# Patient Record
Sex: Female | Born: 1975 | Race: Black or African American | Hispanic: No | Marital: Married | Smoking: Never smoker
Health system: Southern US, Community
[De-identification: ages and names within clinical notes are randomized; demographics above are authoritative.]

## PROBLEM LIST (undated history)

## (undated) DIAGNOSIS — D500 Iron deficiency anemia secondary to blood loss (chronic): Principal | ICD-10-CM

## (undated) DIAGNOSIS — D473 Essential (hemorrhagic) thrombocythemia: Secondary | ICD-10-CM

## (undated) HISTORY — DX: Essential (hemorrhagic) thrombocythemia: D47.3

## (undated) HISTORY — PX: OTHER SURGICAL HISTORY: SHX169

## (undated) HISTORY — DX: Iron deficiency anemia secondary to blood loss (chronic): D50.0

---

## 2003-09-30 ENCOUNTER — Emergency Department (HOSPITAL_COMMUNITY): Admission: EM | Admit: 2003-09-30 | Discharge: 2003-09-30 | Payer: Self-pay | Admitting: Family Medicine

## 2004-02-15 ENCOUNTER — Emergency Department (HOSPITAL_COMMUNITY): Admission: EM | Admit: 2004-02-15 | Discharge: 2004-02-15 | Payer: Self-pay | Admitting: Emergency Medicine

## 2005-10-16 ENCOUNTER — Ambulatory Visit: Payer: Self-pay | Admitting: Obstetrics and Gynecology

## 2005-10-16 ENCOUNTER — Encounter (INDEPENDENT_AMBULATORY_CARE_PROVIDER_SITE_OTHER): Payer: Self-pay | Admitting: Specialist

## 2006-12-14 ENCOUNTER — Other Ambulatory Visit: Admission: RE | Admit: 2006-12-14 | Discharge: 2006-12-14 | Payer: Self-pay | Admitting: Obstetrics and Gynecology

## 2008-02-23 ENCOUNTER — Other Ambulatory Visit: Admission: RE | Admit: 2008-02-23 | Discharge: 2008-02-23 | Payer: Self-pay | Admitting: Gynecology

## 2008-04-12 ENCOUNTER — Ambulatory Visit (HOSPITAL_COMMUNITY): Admission: RE | Admit: 2008-04-12 | Discharge: 2008-04-12 | Payer: Self-pay | Admitting: Obstetrics and Gynecology

## 2009-11-08 ENCOUNTER — Emergency Department (HOSPITAL_COMMUNITY): Admission: EM | Admit: 2009-11-08 | Discharge: 2009-11-08 | Payer: Self-pay | Admitting: Family Medicine

## 2010-10-18 NOTE — Group Therapy Note (Signed)
NAME:  PAULETTE, ROCKFORD NO.:  [id number]   MEDICAL RECORD NO.:  [id number]          PATIENT TYPE:  WOC   LOCATION:  [facility]                   FACILITY:  [facility]   PHYSICIAN:  Argentina Donovan, MD        DATE OF BIRTH:  Jul 01, 1975   DATE OF SERVICE:  10/16/2005                                    CLINIC NOTE   HISTORY:  The patient is a 35 year old gravida 1, para 1-0-0-1 with a child  73 years old by normal vaginal delivery who has no health problems or  complaints.  She thought she felt lumps in her upper abdomen and is in for a  Pap smear.   PHYSICAL EXAMINATION:  ABDOMEN:  The upper abdomen is soft, flat, nontender.  No masses, no organomegaly palpated.  GYN:  External genitalia are normal.  BUS within normal limits.  Vagina is  clean and well rugated.  Cervix is clean and parous.  Uterus is anterior;  normal size, shape and consistency.  Adnexa could not be palpated because of  the __________  of the patient.  In addition, the patient was instructed in  monthly breast examinations.  The patient had a normal GYN examination.           ______________________________  Argentina Donovan, MD     PR/MEDQ  D:  10/16/2005  T:  10/17/2005  Job:  [id number]

## 2011-05-19 ENCOUNTER — Emergency Department (INDEPENDENT_AMBULATORY_CARE_PROVIDER_SITE_OTHER)
Admission: EM | Admit: 2011-05-19 | Discharge: 2011-05-19 | Disposition: A | Discharge status: Home / Self Care | Payer: Self-pay | Source: Home / Self Care

## 2011-05-19 DIAGNOSIS — S39012A Strain of muscle, fascia and tendon of lower back, initial encounter: Secondary | ICD-10-CM

## 2011-05-19 DIAGNOSIS — S161XXA Strain of muscle, fascia and tendon at neck level, initial encounter: Secondary | ICD-10-CM

## 2011-05-19 DIAGNOSIS — S139XXA Sprain of joints and ligaments of unspecified parts of neck, initial encounter: Secondary | ICD-10-CM

## 2011-05-19 DIAGNOSIS — S335XXA Sprain of ligaments of lumbar spine, initial encounter: Secondary | ICD-10-CM

## 2011-05-19 MED ORDER — IBUPROFEN 800 MG PO TABS: 800.0000 mg | ORAL_TABLET | Freq: Three times a day (TID) | ORAL | Status: AC

## 2011-05-19 MED ORDER — METHOCARBAMOL 750 MG PO TABS: 750.0000 mg | ORAL_TABLET | Freq: Four times a day (QID) | ORAL | Status: AC
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
## 2011-05-19 NOTE — ED Provider Notes (Signed)
Medical screening examination/treatment/procedure(s) were performed by non-physician practitioner and as supervising physician I was immediately available for consultation/collaboration.  Raynald Blend, MD 05/19/11 1451

## 2014-02-02 ENCOUNTER — Other Ambulatory Visit (HOSPITAL_COMMUNITY)
Admission: RE | Admit: 2014-02-02 | Discharge: 2014-02-02 | Disposition: A | Discharge status: Home / Self Care | Payer: BC Managed Care – PPO | Source: Ambulatory Visit | Attending: Family | Admitting: Family

## 2014-02-02 ENCOUNTER — Other Ambulatory Visit: Payer: Self-pay | Admitting: Family

## 2014-02-02 ENCOUNTER — Other Ambulatory Visit: Payer: Self-pay

## 2014-02-02 DIAGNOSIS — Z1231 Encounter for screening mammogram for malignant neoplasm of breast: Secondary | ICD-10-CM

## 2014-02-02 DIAGNOSIS — Z113 Encounter for screening for infections with a predominantly sexual mode of transmission: Secondary | ICD-10-CM | POA: Diagnosis present

## 2014-02-02 DIAGNOSIS — N760 Acute vaginitis: Secondary | ICD-10-CM | POA: Diagnosis present

## 2014-02-02 DIAGNOSIS — Z0000 Encounter for general adult medical examination without abnormal findings: Secondary | ICD-10-CM | POA: Insufficient documentation

## 2014-02-03 ENCOUNTER — Other Ambulatory Visit: Payer: Self-pay | Admitting: Family

## 2014-02-03 DIAGNOSIS — R1011 Right upper quadrant pain: Secondary | ICD-10-CM

## 2014-02-07 LAB — CYTOLOGY - PAP

## 2014-02-16 ENCOUNTER — Ambulatory Visit
Admission: RE | Admit: 2014-02-16 | Discharge: 2014-02-16 | Disposition: A | Discharge status: Home / Self Care | Payer: BC Managed Care – PPO | Source: Ambulatory Visit | Attending: Family | Admitting: Family

## 2014-02-16 DIAGNOSIS — R1011 Right upper quadrant pain: Secondary | ICD-10-CM

## 2014-02-17 ENCOUNTER — Ambulatory Visit
Admission: RE | Admit: 2014-02-17 | Discharge: 2014-02-17 | Disposition: A | Discharge status: Home / Self Care | Payer: BC Managed Care – PPO | Source: Ambulatory Visit | Attending: Family | Admitting: Family

## 2014-02-17 DIAGNOSIS — Z1231 Encounter for screening mammogram for malignant neoplasm of breast: Secondary | ICD-10-CM

## 2014-02-20 ENCOUNTER — Other Ambulatory Visit: Payer: Self-pay | Admitting: Family

## 2014-02-20 DIAGNOSIS — R928 Other abnormal and inconclusive findings on diagnostic imaging of breast: Secondary | ICD-10-CM

## 2014-02-21 ENCOUNTER — Telehealth: Payer: Self-pay | Admitting: Hematology and Oncology

## 2014-02-21 NOTE — Telephone Encounter (Signed)
PATIENT CALLED TO CONFIRM NP APPT FOR 10/07 @ 10:45 W/DR. [place]

## 2014-02-21 NOTE — Telephone Encounter (Signed)
LEFT MESSAGE FOR PATIENT AND GAVE NP APPT FOR 10/05 @ 2 W/DR. [place]. CONTACT INFORMATION LEFT FOR PATIENT TO RETURN CALL TO CONFIRM MESSAGE/NP APPT.

## 2014-02-27 ENCOUNTER — Ambulatory Visit
Admission: RE | Admit: 2014-02-27 | Discharge: 2014-02-27 | Disposition: A | Discharge status: Home / Self Care | Payer: BC Managed Care – PPO | Source: Ambulatory Visit | Attending: Family | Admitting: Family

## 2014-02-27 DIAGNOSIS — R928 Other abnormal and inconclusive findings on diagnostic imaging of breast: Secondary | ICD-10-CM

## 2014-03-06 ENCOUNTER — Ambulatory Visit: Payer: BC Managed Care – PPO | Admitting: Hematology and Oncology

## 2014-03-06 ENCOUNTER — Ambulatory Visit: Payer: BC Managed Care – PPO

## 2014-03-08 ENCOUNTER — Ambulatory Visit (HOSPITAL_BASED_OUTPATIENT_CLINIC_OR_DEPARTMENT_OTHER): Payer: BC Managed Care – PPO | Admitting: Hematology and Oncology

## 2014-03-08 ENCOUNTER — Encounter (INDEPENDENT_AMBULATORY_CARE_PROVIDER_SITE_OTHER): Payer: Self-pay

## 2014-03-08 ENCOUNTER — Telehealth: Payer: Self-pay | Admitting: Hematology and Oncology

## 2014-03-08 ENCOUNTER — Encounter: Payer: Self-pay | Admitting: Hematology and Oncology

## 2014-03-08 ENCOUNTER — Ambulatory Visit: Payer: BC Managed Care – PPO

## 2014-03-08 VITALS — BP 113/77 | HR 74 | Temp 98.9°F | Resp 18 | Ht 63.0 in | Wt 185.1 lb

## 2014-03-08 DIAGNOSIS — D75839 Thrombocytosis, unspecified: Secondary | ICD-10-CM

## 2014-03-08 DIAGNOSIS — D473 Essential (hemorrhagic) thrombocythemia: Secondary | ICD-10-CM

## 2014-03-08 DIAGNOSIS — D500 Iron deficiency anemia secondary to blood loss (chronic): Secondary | ICD-10-CM

## 2014-03-08 HISTORY — DX: Thrombocytosis, unspecified: D75.839

## 2014-03-08 HISTORY — DX: Iron deficiency anemia secondary to blood loss (chronic): D50.0

## 2014-03-08 NOTE — Telephone Encounter (Signed)
gv adn printed appt sched and avs for tpf ro OCT, NOV adn Dec....sed added tx.

## 2014-03-08 NOTE — Progress Notes (Signed)
Checked in new patient with no financial issues prior to seeing the dr. She has appt card and has not been out of [place]

## 2014-03-08 NOTE — Assessment & Plan Note (Signed)
The most likely cause of her anemia is due to chronic blood loss. We discussed some of the risks, benefits, and alternatives of intravenous iron infusions. The patient is symptomatic from anemia and the iron level is critically low. She tolerated oral iron supplement poorly and desires to achieved higher levels of iron faster for adequate hematopoesis. Some of the side-effects to be expected including risks of infusion reactions, phlebitis, headaches, nausea and fatigue.  The patient is willing to proceed. Patient education material was dispensed.  Goal is to keep ferritin level greater than 50

## 2014-03-08 NOTE — Progress Notes (Signed)
[facility] NOTE  Patient Care Team: Eloise Levels, NP as PCP - General (Nurse Practitioner)  CHIEF COMPLAINTS/PURPOSE OF CONSULTATION:  Anemia and thrombocytosis  HISTORY OF PRESENTING ILLNESS:  Tonya Pollard 38 y.o. female is here because of abnormal CBC.  She was found to have abnormal CBC from routine blood work. Review of her most recent CBC on 02/15/14 showed hemoglobin of 10.7 with platelet count of 596,000. In the past, she had hemoglobin as low as 6. She denies recent chest pain on exertion, shortness of breath on minimal exertion, pre-syncopal episodes, or palpitations. She complained of dizziness, excessive fatigue and significant leg cramps. She had not noticed any recent bleeding such as epistaxis, hematuria or hematochezia. She has menorrhagia, with typical menstrual cycle lasting 7 days with cycle of 30 days and passage of heavy clots. She had prior diagnosis of uterine fibroids s/p uterine ablatioon. The patient take over the counter NSAID ingestion. She is not on antiplatelets agents.  She had no prior history or diagnosis of cancer. Her age appropriate screening programs are up-to-date. She eats a variety of diet. She has pica with excessive chewing of ice. She never donated blood or received blood transfusion The patient was prescribed oral iron supplements and she takes twice a day for many months, complicated by constipation.  MEDICAL HISTORY:  Past Medical History  Diagnosis Date  . Iron deficiency anemia due to chronic blood loss 03/08/2014  . Thrombocytosis 03/08/2014    SURGICAL HISTORY: Past Surgical History  Procedure Laterality Date  . Fibroids      SOCIAL HISTORY: History   Social History  . Marital Status: Married    Spouse Name: N/A    Number of Children: N/A  . Years of Education: N/A   Occupational History  . Not on file.   Social History Main Topics  . Smoking status: Never Smoker   . Smokeless tobacco: Never Used   . Alcohol Use: No  . Drug Use: No  . Sexual Activity: Not on file   Other Topics Concern  . Not on file   Social History Narrative  . No narrative on file    FAMILY HISTORY: Family History  Problem Relation Age of Onset  . Cancer Maternal Aunt     female cancer  . Cancer Maternal Uncle     throat ca  . Cancer Paternal Aunt     breast ca  . Cancer Paternal Aunt     ovarian ca    ALLERGIES:  is allergic to penicillins.  MEDICATIONS:  Current Outpatient Prescriptions  Medication Sig Dispense Refill  . Iron TABS Take by mouth 2 (two) times daily.       No current facility-administered medications for this visit.    REVIEW OF SYSTEMS:   Constitutional: Denies fevers, chills or abnormal night sweats Eyes: Denies blurriness of vision, double vision or watery eyes Ears, nose, mouth, throat, and face: Denies mucositis or sore throat Respiratory: Denies cough, dyspnea or wheezes Cardiovascular: Denies palpitation, chest discomfort or lower extremity swelling Gastrointestinal:  Denies nausea, heartburn or change in bowel habits Skin: Denies abnormal skin rashes Lymphatics: Denies new lymphadenopathy or easy bruising Neurological:Denies numbness, tingling or new weaknesses Behavioral/Psych: Mood is stable, no new changes  All other systems were reviewed with the patient and are negative.  PHYSICAL EXAMINATION: ECOG PERFORMANCE STATUS: 1 - Symptomatic but completely ambulatory  Filed Vitals:   03/08/14 1046  BP: 113/77  Pulse: 74  Temp: 98.9 F (37.2 C)  Resp: 18   Filed Weights   03/08/14 1046  Weight: 185 lb 1.6 oz (83.961 kg)    GENERAL:alert, no distress and comfortable SKIN: skin color, texture, turgor are normal, no rashes or significant lesions EYES: normal, conjunctiva are pale and non-injected, sclera clear OROPHARYNX:no exudate, no erythema and lips, buccal mucosa, and tongue normal  NECK: supple, thyroid normal size, non-tender, without  nodularity LYMPH:  no palpable lymphadenopathy in the cervical, axillary or inguinal LUNGS: clear to auscultation and percussion with normal breathing effort HEART: regular rate & rhythm and no murmurs and no lower extremity edema ABDOMEN:abdomen soft, non-tender and normal bowel sounds Musculoskeletal:no cyanosis of digits and no clubbing  PSYCH: alert & oriented x 3 with fluent speech NEURO: no focal motor/sensory deficits  LABORATORY DATA:  I have reviewed the data as listed Recent Results (from the past 2160 hour(s))  CYTOLOGY - PAP     Status: None   Collection Time    02/02/14 12:00 AM      Result Value Ref Range   CYTOLOGY - PAP PAP RESULT      RADIOGRAPHIC STUDIES: I have personally reviewed the radiological images as listed and agreed with the findings in the report. Mm Digital Diagnostic Bilat  02/27/2014   CLINICAL DATA:  Possible bilateral breast masses at recent screening mammography.  EXAM: DIGITAL DIAGNOSTIC  BILATERAL MAMMOGRAM WITH CAD  COMPARISON:  Baseline screening mammogram dated 02/17/2014.  ACR Breast Density Category c: The breast tissue is heterogeneously dense, which may obscure small masses.  FINDINGS: Spot compression views of both breasts and a true lateral view of the right breast demonstrate normal appearing fibroglandular tissue at the locations of the recently suspected bilateral breast masses.  Mammographic images were processed with CAD.  IMPRESSION: No evidence of malignancy. The recently suspected bilateral breast masses represented overlapping of normal fibroglandular tissue  RECOMMENDATION: Bilateral screening mammogram in 1 year.  I have discussed the findings and recommendations with the patient. Results were also provided in writing at the conclusion of the visit. If applicable, a reminder letter will be sent to the patient regarding the next appointment.  BI-RADS CATEGORY  1: Negative.   Electronically Signed   By: Enrique Sack M.D.   On: 02/27/2014  07:58   Mm Digital Screening Bilateral  02/17/2014   CLINICAL DATA:  Screening.  EXAM: DIGITAL SCREENING BILATERAL MAMMOGRAM WITH CAD  COMPARISON:  Previous Exam(s)  ACR Breast Density Category c: The breast tissue is heterogeneously dense, which may obscure small masses.  FINDINGS: In the left breast possible mass warrants further imaging evaluation with spot compression views and possible ultrasound. In the right breast, possible mass warrants further imaging evaluation with spot compression views and possible ultrasound. Images were processed with CAD.  IMPRESSION: Further imaging evaluation is suggested for possible mass in the left breast.  Further imaging evaluation is suggested for possible mass in the right breast.  RECOMMENDATION: Diagnostic mammogram and possibly ultrasound of both breasts. (Code:FI-B-88M)  The patient will be contacted regarding the findings, and additional imaging will be scheduled.  BI-RADS CATEGORY  0: Incomplete. Need additional imaging evaluation and/or prior mammograms for comparison.   Electronically Signed   By: Marin Olp M.D.   On: 02/17/2014 15:11   US Abdomen Limited Ruq  02/16/2014   CLINICAL DATA:  Right upper quadrant pain  EXAM: US ABDOMEN LIMITED - RIGHT UPPER QUADRANT  COMPARISON:  None.  FINDINGS: Gallbladder:  No gallstones or wall thickening visualized. No sonographic Percell Miller  sign noted.  Common bile duct:  Diameter: 5 mm  Liver:  No focal lesion identified. Within normal limits in parenchymal echogenicity.  IMPRESSION: Normal right upper quadrant ultrasound.   Electronically Signed   By: Kathreen Devoid   On: 02/16/2014 11:06    ASSESSMENT & PLAN:  Iron deficiency anemia due to chronic blood loss The most likely cause of her anemia is due to chronic blood loss. We discussed some of the risks, benefits, and alternatives of intravenous iron infusions. The patient is symptomatic from anemia and the iron level is critically low. She tolerated oral iron  supplement poorly and desires to achieved higher levels of iron faster for adequate hematopoesis. Some of the side-effects to be expected including risks of infusion reactions, phlebitis, headaches, nausea and fatigue.  The patient is willing to proceed. Patient education material was dispensed.  Goal is to keep ferritin level greater than 50   Thrombocytosis Likely reactive in nature. I will reorder labs after IV iron infusion    All questions were answered. The patient knows to call the clinic with any problems, questions or concerns. I spent 40 minutes counseling the patient face to face. The total time spent in the appointment was 55 minutes and more than 50% was on counseling.     [facility], [place], MD 03/08/2014 7:35 PM

## 2014-03-08 NOTE — Assessment & Plan Note (Signed)
Likely reactive in nature. I will reorder labs after IV iron infusion

## 2014-03-10 ENCOUNTER — Other Ambulatory Visit: Payer: Self-pay | Admitting: Hematology and Oncology

## 2014-03-10 ENCOUNTER — Ambulatory Visit (HOSPITAL_BASED_OUTPATIENT_CLINIC_OR_DEPARTMENT_OTHER): Payer: BC Managed Care – PPO

## 2014-03-10 VITALS — BP 109/58 | HR 91 | Temp 98.4°F

## 2014-03-10 DIAGNOSIS — D500 Iron deficiency anemia secondary to blood loss (chronic): Secondary | ICD-10-CM

## 2014-03-10 MED ORDER — SODIUM CHLORIDE 0.9 % IV SOLN
1020.0000 mg | Freq: Once | INTRAVENOUS | Status: AC
  Administered 2014-03-10: 1020 mg via INTRAVENOUS
  Filled 2014-03-10: qty 34

## 2014-03-10 MED ORDER — SODIUM CHLORIDE 0.9 % IV SOLN
Freq: Once | INTRAVENOUS | Status: AC
Start: 2014-03-10 — End: 2014-03-10
  Administered 2014-03-10: 09:00:00 via INTRAVENOUS

## 2014-03-10 NOTE — Progress Notes (Signed)
Patient observed 30 minutes post Feraheme infusion, patient discharged ambulatory in no acute distress.

## 2014-03-10 NOTE — Patient Instructions (Addendum)
Anemia, Nonspecific Anemia is a condition in which the concentration of red blood cells or hemoglobin in the blood is below normal. Hemoglobin is a substance in red blood cells that carries oxygen to the tissues of the body. Anemia results in not enough oxygen reaching these tissues.  CAUSES  Common causes of anemia include:   Excessive bleeding. Bleeding may be internal or external. This includes excessive bleeding from periods (in women) or from the intestine.   Poor nutrition.   Chronic kidney, thyroid, and liver disease.  Bone marrow disorders that decrease red blood cell production.  Cancer and treatments for cancer.  HIV, AIDS, and their treatments.  Spleen problems that increase red blood cell destruction.  Blood disorders.  Excess destruction of red blood cells due to infection, medicines, and autoimmune disorders. SIGNS AND SYMPTOMS   Minor weakness.   Dizziness.   Headache.  Palpitations.   Shortness of breath, especially with exercise.   Paleness.  Cold sensitivity.  Indigestion.  Nausea.  Difficulty sleeping.  Difficulty concentrating. Symptoms may occur suddenly or they may develop slowly.  DIAGNOSIS  Additional blood tests are often needed. These help your health care provider determine the best treatment. Your health care provider will check your stool for blood and look for other causes of blood loss.  TREATMENT  Treatment varies depending on the cause of the anemia. Treatment can include:   Supplements of iron, vitamin E42, or folic acid.   Hormone medicines.   A blood transfusion. This may be needed if blood loss is severe.   Hospitalization. This may be needed if there is significant continual blood loss.   Dietary changes.  Spleen removal. HOME CARE INSTRUCTIONS Keep all follow-up appointments. It often takes many weeks to correct anemia, and having your health care provider check on your condition and your response to  treatment is very important. SEEK IMMEDIATE MEDICAL CARE IF:   You develop extreme weakness, shortness of breath, or chest pain.   You become dizzy or have trouble concentrating.  You develop heavy vaginal bleeding.   You develop a rash.   You have bloody or black, tarry stools.   You faint.   You vomit up blood.   You vomit repeatedly.   You have abdominal pain.  You have a fever or persistent symptoms for more than 2-3 days.   You have a fever and your symptoms suddenly get worse.   You are dehydrated.  MAKE SURE YOU:  Understand these instructions.  Will watch your condition.  Will get help right away if you are not doing well or get worse. Document Released: 06/26/2004 Document Revised: 01/19/2013 Document Reviewed: 11/12/2012 [facility] Patient Information 2015 [place], [place]. This information is not intended to replace advice given to you by your health care provider. Make sure you discuss any questions you have with your health care provider.  Ferumoxytol injection (Feraheme) What is this medicine? FERUMOXYTOL is an iron complex. Iron is used to make healthy red blood cells, which carry oxygen and nutrients throughout the body. This medicine is used to treat iron deficiency anemia in people with chronic kidney disease. This medicine may be used for other purposes; ask your health care provider or pharmacist if you have questions. COMMON BRAND NAME(S): Feraheme What should I tell my health care provider before I take this medicine? They need to know if you have any of these conditions: -anemia not caused by low iron levels -high levels of iron in the blood -magnetic resonance imaging (  MRI) test scheduled -an unusual or allergic reaction to iron, other medicines, foods, dyes, or preservatives -pregnant or trying to get pregnant -breast-feeding How should I use this medicine? This medicine is for injection into a vein. It is given by a health care  professional in a hospital or clinic setting. Talk to your pediatrician regarding the use of this medicine in children. Special care may be needed. Overdosage: If you think you've taken too much of this medicine contact a poison control center or emergency room at once. Overdosage: If you think you have taken too much of this medicine contact a poison control center or emergency room at once. NOTE: This medicine is only for you. Do not share this medicine with others. What if I miss a dose? It is important not to miss your dose. Call your doctor or health care professional if you are unable to keep an appointment. What may interact with this medicine? This medicine may interact with the following medications: -other iron products This list may not describe all possible interactions. Give your health care provider a list of all the medicines, herbs, non-prescription drugs, or dietary supplements you use. Also tell them if you smoke, drink alcohol, or use illegal drugs. Some items may interact with your medicine. What should I watch for while using this medicine? Visit your doctor or healthcare professional regularly. Tell your doctor or healthcare professional if your symptoms do not start to get better or if they get worse. You may need blood work done while you are taking this medicine. You may need to follow a special diet. Talk to your doctor. Foods that contain iron include: whole grains/cereals, dried fruits, beans, or peas, leafy green vegetables, and organ meats (liver, kidney). What side effects may I notice from receiving this medicine? Side effects that you should report to your doctor or health care professional as soon as possible: -allergic reactions like skin rash, itching or hives, swelling of the face, lips, or tongue -breathing problems -changes in blood pressure -feeling faint or lightheaded, falls -fever or chills -flushing, sweating, or hot feelings -swelling of the ankles  or feet Side effects that usually do not require medical attention (Report these to your doctor or health care professional if they continue or are bothersome.): -diarrhea -headache -nausea, vomiting -stomach pain This list may not describe all possible side effects. Call your doctor for medical advice about side effects. You may report side effects to FDA at 1-800-FDA-1088. Where should I keep my medicine? This drug is given in a hospital or clinic and will not be stored at home. NOTE: This sheet is a summary. It may not cover all possible information. If you have questions about this medicine, talk to your doctor, pharmacist, or health care provider.  2015, [employer] Standard. (2012-01-02 15:23:36)

## 2014-04-13 ENCOUNTER — Other Ambulatory Visit: Payer: Self-pay | Admitting: Hematology and Oncology

## 2014-04-21 ENCOUNTER — Other Ambulatory Visit (HOSPITAL_BASED_OUTPATIENT_CLINIC_OR_DEPARTMENT_OTHER): Payer: BC Managed Care – PPO

## 2014-04-21 DIAGNOSIS — D500 Iron deficiency anemia secondary to blood loss (chronic): Secondary | ICD-10-CM

## 2014-04-21 DIAGNOSIS — D75839 Thrombocytosis, unspecified: Secondary | ICD-10-CM

## 2014-04-21 DIAGNOSIS — D473 Essential (hemorrhagic) thrombocythemia: Secondary | ICD-10-CM

## 2014-04-21 LAB — CBC & DIFF AND RETIC
BASO%: 0.8 % (ref 0.0–2.0)
BASOS ABS: 0.1 10*3/uL (ref 0.0–0.1)
EOS%: 4.2 % (ref 0.0–7.0)
Eosinophils Absolute: 0.3 10*3/uL (ref 0.0–0.5)
HEMATOCRIT: 37.3 % (ref 34.8–46.6)
HGB: 12.2 g/dL (ref 11.6–15.9)
Immature Retic Fract: 6 % (ref 1.60–10.00)
LYMPH#: 2.3 10*3/uL (ref 0.9–3.3)
LYMPH%: 34.3 % (ref 14.0–49.7)
MCH: 30.2 pg (ref 25.1–34.0)
MCHC: 32.7 g/dL (ref 31.5–36.0)
MCV: 92.3 fL (ref 79.5–101.0)
MONO#: 0.4 10*3/uL (ref 0.1–0.9)
MONO%: 5.9 % (ref 0.0–14.0)
NEUT%: 54.8 % (ref 38.4–76.8)
NEUTROS ABS: 3.6 10*3/uL (ref 1.5–6.5)
PLATELETS: 425 10*3/uL — AB (ref 145–400)
RBC: 4.04 10*6/uL (ref 3.70–5.45)
RDW: 17.8 % — ABNORMAL HIGH (ref 11.2–14.5)
RETIC CT ABS: 81.2 10*3/uL (ref 33.70–90.70)
Retic %: 2.01 % (ref 0.70–2.10)
WBC: 6.6 10*3/uL (ref 3.9–10.3)

## 2014-04-21 LAB — IRON AND TIBC CHCC
%SAT: 17 % — AB (ref 21–57)
Iron: 52 ug/dL (ref 41–142)
TIBC: 296 ug/dL (ref 236–444)
UIBC: 245 ug/dL (ref 120–384)

## 2014-04-21 LAB — SEDIMENTATION RATE: Sed Rate: 9 mm/hr (ref 0–22)

## 2014-04-21 LAB — MORPHOLOGY: PLT EST: INCREASED

## 2014-04-21 LAB — FERRITIN CHCC: Ferritin: 354 ng/ml — ABNORMAL HIGH (ref 9–269)

## 2014-05-03 ENCOUNTER — Ambulatory Visit (HOSPITAL_BASED_OUTPATIENT_CLINIC_OR_DEPARTMENT_OTHER): Payer: BC Managed Care – PPO | Admitting: Hematology and Oncology

## 2014-05-03 ENCOUNTER — Encounter: Payer: Self-pay | Admitting: Hematology and Oncology

## 2014-05-03 VITALS — BP 123/76 | HR 90 | Temp 98.6°F | Resp 18 | Ht 63.0 in | Wt 185.7 lb

## 2014-05-03 DIAGNOSIS — D500 Iron deficiency anemia secondary to blood loss (chronic): Secondary | ICD-10-CM

## 2014-05-03 DIAGNOSIS — D473 Essential (hemorrhagic) thrombocythemia: Secondary | ICD-10-CM

## 2014-05-03 DIAGNOSIS — D75839 Thrombocytosis, unspecified: Secondary | ICD-10-CM

## 2014-05-03 NOTE — Assessment & Plan Note (Signed)
This is reactive in nature, improving since intravenous iron replacement. I reassured the patient.

## 2014-05-03 NOTE — Progress Notes (Signed)
[place] OFFICE PROGRESS NOTE  Eloise Levels, NP SUMMARY OF HEMATOLOGIC HISTORY: She was found to have abnormal CBC from routine blood work. Review of her most recent CBC on 02/15/14 showed hemoglobin of 10.7 with platelet count of 596,000. In the past, she had hemoglobin as low as 6. She denies recent chest pain on exertion, shortness of breath on minimal exertion, pre-syncopal episodes, or palpitations. She complained of dizziness, excessive fatigue and significant leg cramps. She had not noticed any recent bleeding such as epistaxis, hematuria or hematochezia. She has menorrhagia, with typical menstrual cycle lasting 7 days with cycle of 30 days and passage of heavy clots. She had prior diagnosis of uterine fibroids s/p uterine ablatioon. The patient take over the counter NSAID ingestion. She is not on antiplatelets agents.  She had no prior history or diagnosis of cancer. Her age appropriate screening programs are up-to-date. She eats a variety of diet. She has pica with excessive chewing of ice. She never donated blood or received blood transfusion The patient was prescribed oral iron supplements and she takes twice a day for many months, complicated by constipation. On 03/10/2014, she received IV iron. INTERVAL HISTORY: Oralia Criger 38 y.o. female returns for further follow-up. Her energy level has improved since IV iron. She denies further pica. The patient denies any recent signs or symptoms of bleeding such as spontaneous epistaxis, hematuria or hematochezia.   I have reviewed the past medical history, past surgical history, social history and family history with the patient and they are unchanged from previous note.  ALLERGIES:  is allergic to penicillins.  MEDICATIONS:  Current Outpatient Prescriptions  Medication Sig Dispense Refill  . Iron TABS Take by mouth 2 (two) times daily.     No current facility-administered medications for this visit.     REVIEW  OF SYSTEMS:   Constitutional: Denies fevers, chills or night sweats Eyes: Denies blurriness of vision Ears, nose, mouth, throat, and face: Denies mucositis or sore throat Respiratory: Denies cough, dyspnea or wheezes Cardiovascular: Denies palpitation, chest discomfort or lower extremity swelling Gastrointestinal:  Denies nausea, heartburn or change in bowel habits Skin: Denies abnormal skin rashes Lymphatics: Denies new lymphadenopathy or easy bruising Neurological:Denies numbness, tingling or new weaknesses Behavioral/Psych: Mood is stable, no new changes  All other systems were reviewed with the patient and are negative.  PHYSICAL EXAMINATION: ECOG PERFORMANCE STATUS: 0 - Asymptomatic  Filed Vitals:   05/03/14 1006  BP: 123/76  Pulse: 90  Temp: 98.6 F (37 C)  Resp: 18   Filed Weights   05/03/14 1006  Weight: 185 lb 11.2 oz (84.233 kg)    GENERAL:alert, no distress and comfortable SKIN: skin color, texture, turgor are normal, no rashes or significant lesions EYES: normal, Conjunctiva are pink and non-injected, sclera clear Musculoskeletal:no cyanosis of digits and no clubbing  NEURO: alert & oriented x 3 with fluent speech, no focal motor/sensory deficits  LABORATORY DATA:  I have reviewed the data as listed No results found for this or any previous visit (from the past 48 hour(s)).  Lab Results  Component Value Date   WBC 6.6 04/21/2014   HGB 12.2 04/21/2014   HCT 37.3 04/21/2014   MCV 92.3 04/21/2014   PLT 425* 04/21/2014    ASSESSMENT & PLAN:  Iron deficiency anemia due to chronic blood loss This has resolved with IV iron. I am concerned that she may have recurrent of iron deficiency anemia in the future if her menorrhagia remain persistent. I recommend  recheck iron studies and CBC with PCP within the next 6 months.  Thrombocytosis This is reactive in nature, improving since intravenous iron replacement. I reassured the patient.   All questions were  answered. The patient knows to call the clinic with any problems, questions or concerns. No barriers to learning was detected.  I spent 15 minutes counseling the patient face to face. The total time spent in the appointment was 20 minutes and more than 50% was on counseling.     [facility], [place], MD 05/03/2014 8:55 PM

## 2014-05-03 NOTE — Assessment & Plan Note (Signed)
This has resolved with IV iron. I am concerned that she may have recurrent of iron deficiency anemia in the future if her menorrhagia remain persistent. I recommend recheck iron studies and CBC with PCP within the next 6 months.

## 2015-04-18 ENCOUNTER — Other Ambulatory Visit: Payer: Self-pay

## 2015-04-18 DIAGNOSIS — Z1231 Encounter for screening mammogram for malignant neoplasm of breast: Secondary | ICD-10-CM

## 2015-04-26 IMAGING — US US ABDOMEN LIMITED
1 series · 14 of 25 positions shown · non-contrast
Comparison: None.

CLINICAL DATA: Right upper quadrant pain

EXAM:
US ABDOMEN LIMITED - RIGHT UPPER QUADRANT

[Series 1: us abdomen limited · 0.30mm/px · 14 of 43 slices shown]
[im 1/43]
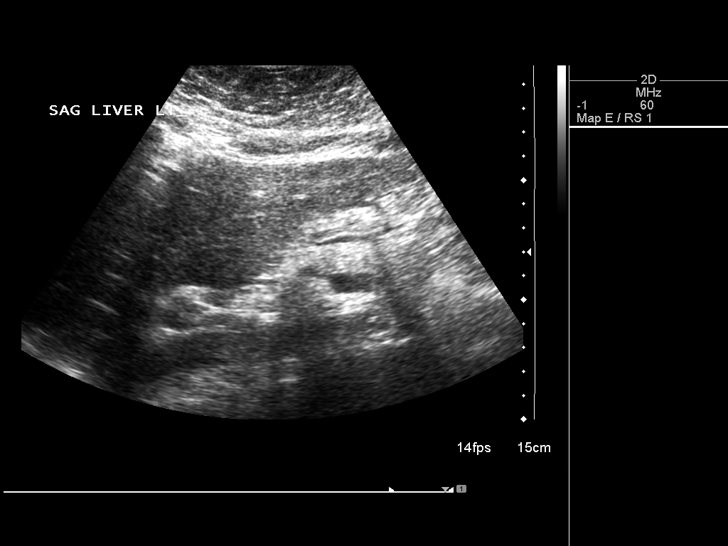
[im 4/43]
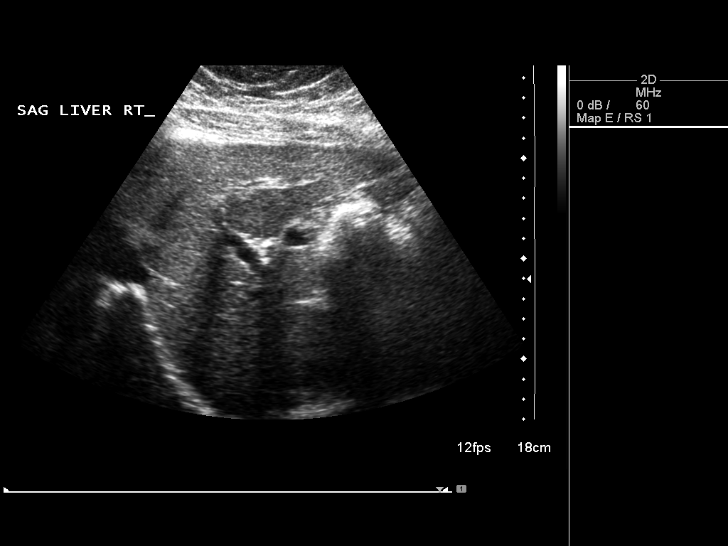
[im 8/43]
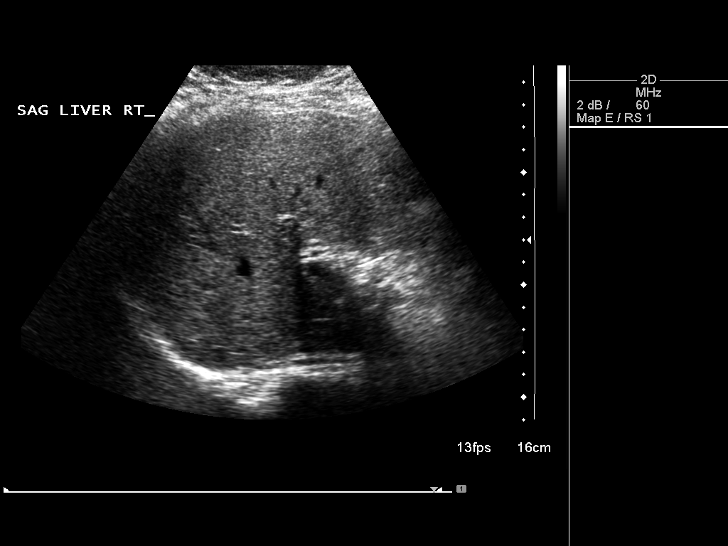
[im 11/43]
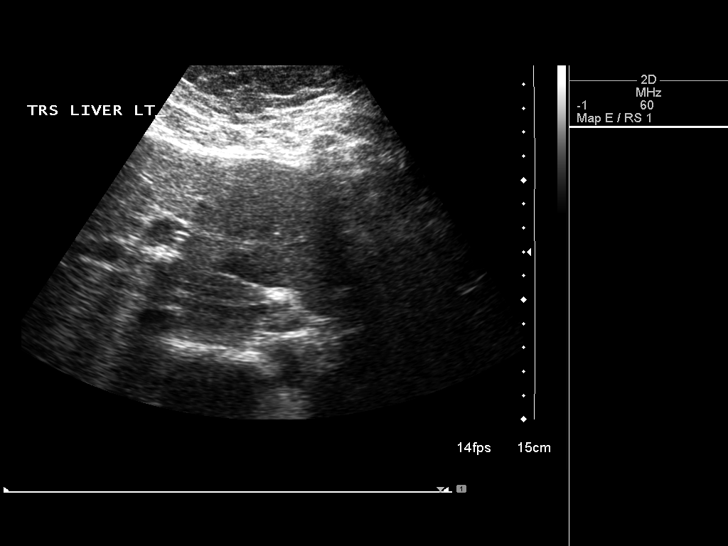
[im 15/43]
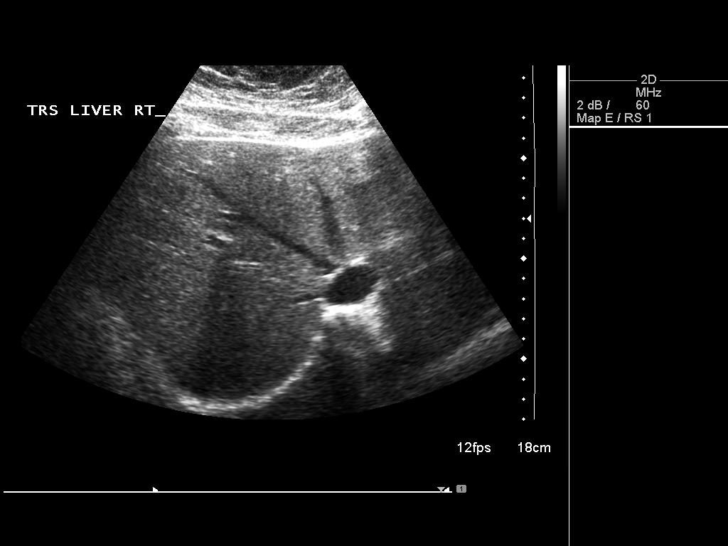
[im 16/43]
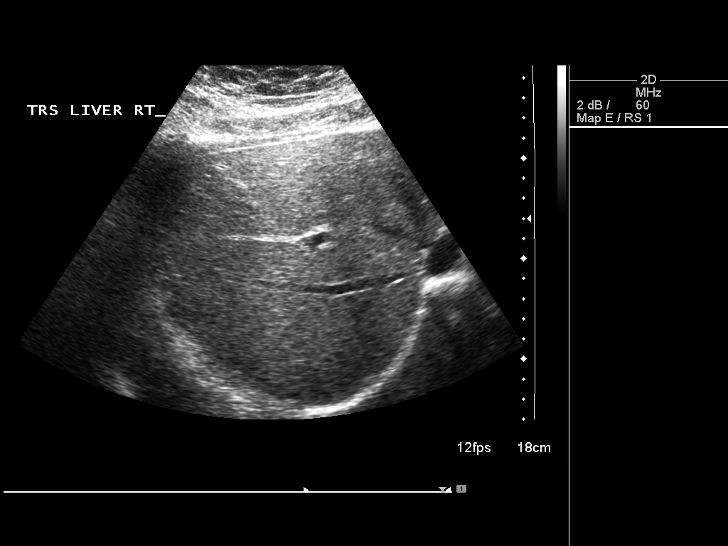
[im 20/43]
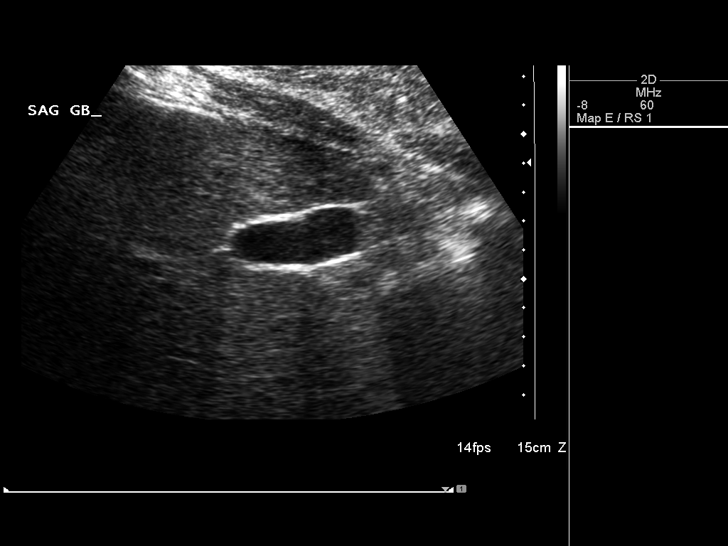
[im 23/43]
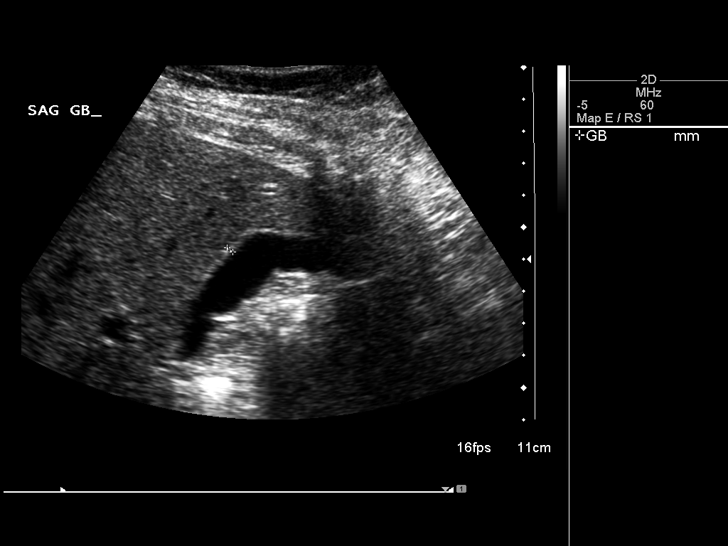
[im 27/43]
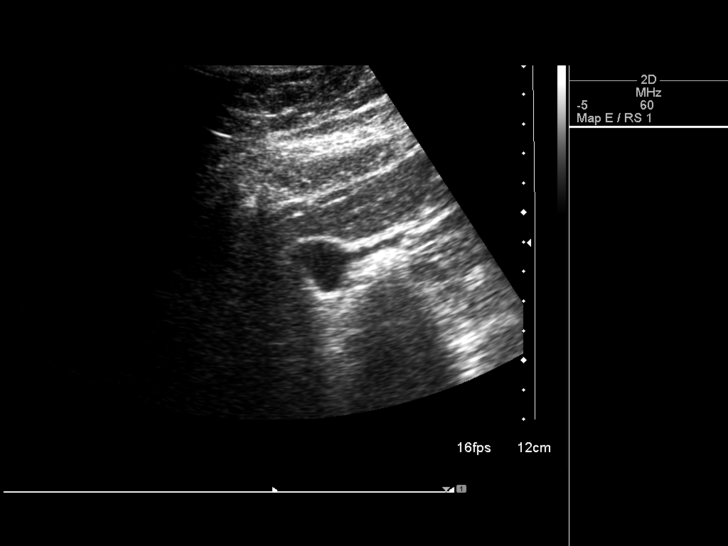
[im 29/43]
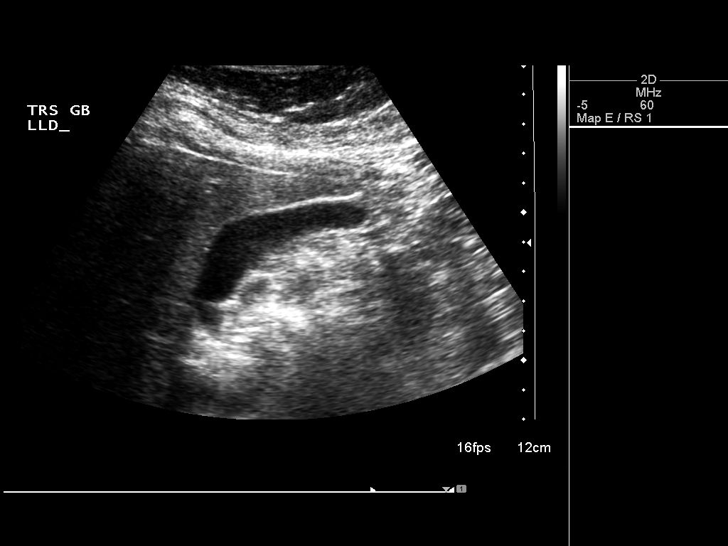
[im 32/43]
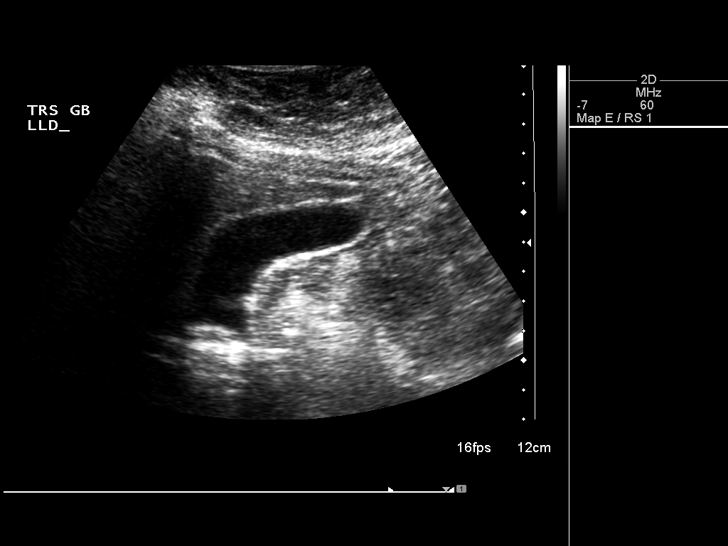
[im 36/43]
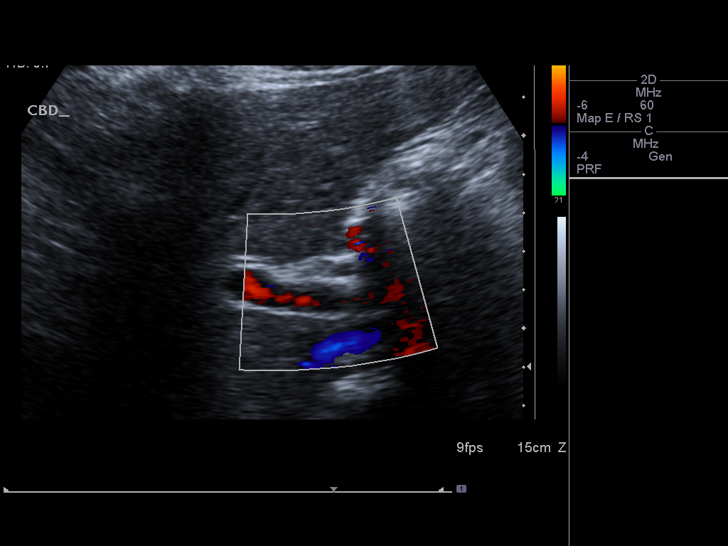
[im 39/43]
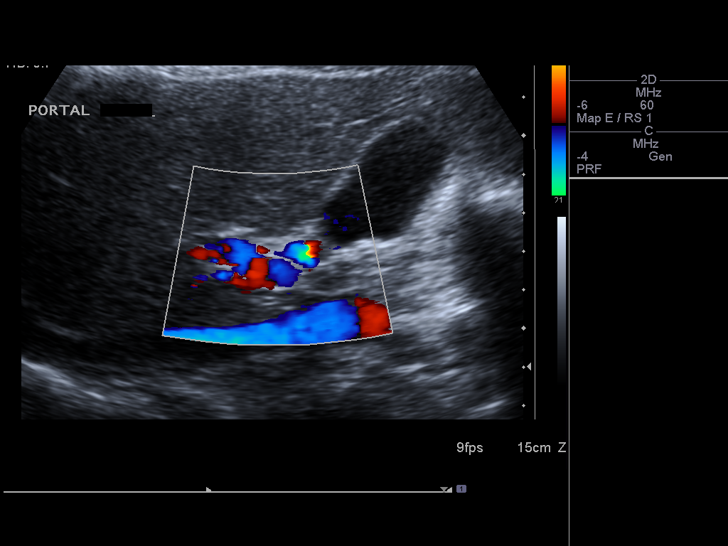
[im 43/43]
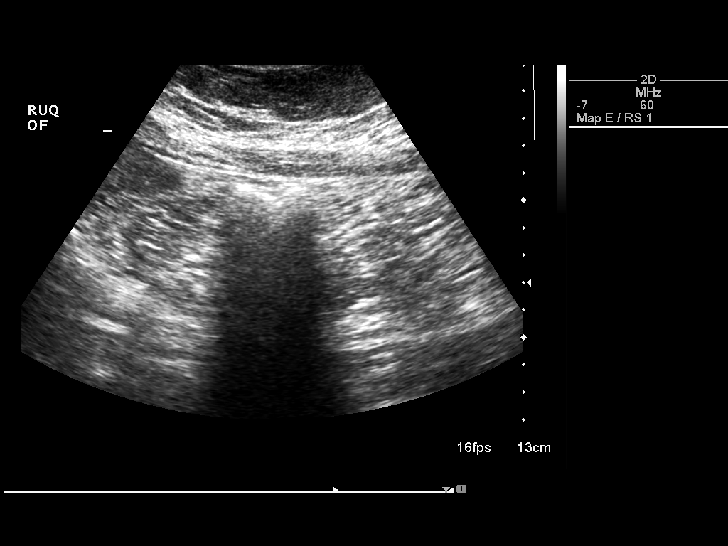

[14 of 25 positions shown; findings below may reference images not displayed]

FINDINGS: Gallbladder:

No gallstones or wall thickening visualized. No sonographic Murphy
sign noted.

Common bile duct:

Diameter: 5 mm

Liver:

No focal lesion identified. Within normal limits in parenchymal
echogenicity.
IMPRESSION: Normal right upper quadrant ultrasound.

## 2015-05-17 ENCOUNTER — Ambulatory Visit
Admission: RE | Admit: 2015-05-17 | Discharge: 2015-05-17 | Disposition: A | Discharge status: Home / Self Care | Payer: BLUE CROSS/BLUE SHIELD | Source: Ambulatory Visit

## 2015-05-17 DIAGNOSIS — Z1231 Encounter for screening mammogram for malignant neoplasm of breast: Secondary | ICD-10-CM

## 2017-09-24 ENCOUNTER — Other Ambulatory Visit: Payer: Self-pay | Admitting: Internal Medicine

## 2017-09-24 DIAGNOSIS — Z1231 Encounter for screening mammogram for malignant neoplasm of breast: Secondary | ICD-10-CM

## 2017-10-13 ENCOUNTER — Ambulatory Visit
Admission: RE | Admit: 2017-10-13 | Discharge: 2017-10-13 | Disposition: A | Discharge status: Home / Self Care | Payer: BLUE CROSS/BLUE SHIELD | Source: Ambulatory Visit | Attending: Internal Medicine | Admitting: Internal Medicine

## 2017-10-13 ENCOUNTER — Other Ambulatory Visit: Payer: Self-pay | Admitting: Obstetrics and Gynecology

## 2017-10-13 DIAGNOSIS — Z1231 Encounter for screening mammogram for malignant neoplasm of breast: Secondary | ICD-10-CM

## 2018-12-21 IMAGING — MG DIGITAL SCREENING BILATERAL MAMMOGRAM WITH TOMO AND CAD
8 series · 8 of 24 positions shown · non-contrast
Comparison: Previous exam(s).

CLINICAL DATA: Screening.

EXAM:
DIGITAL SCREENING BILATERAL MAMMOGRAM WITH TOMO AND CAD

[R CC synth-2D]
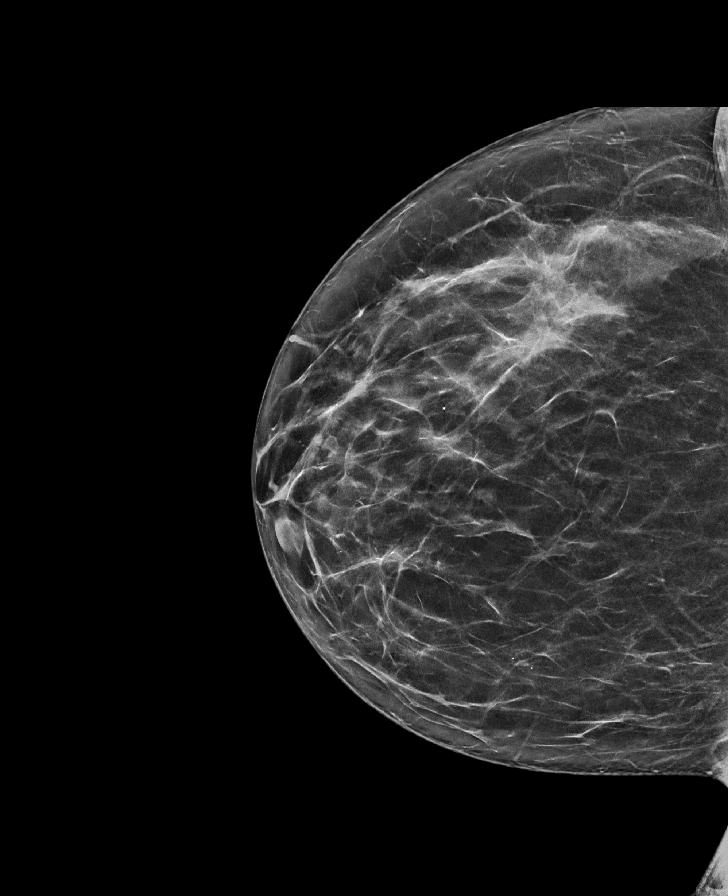

[L CC synth-2D]
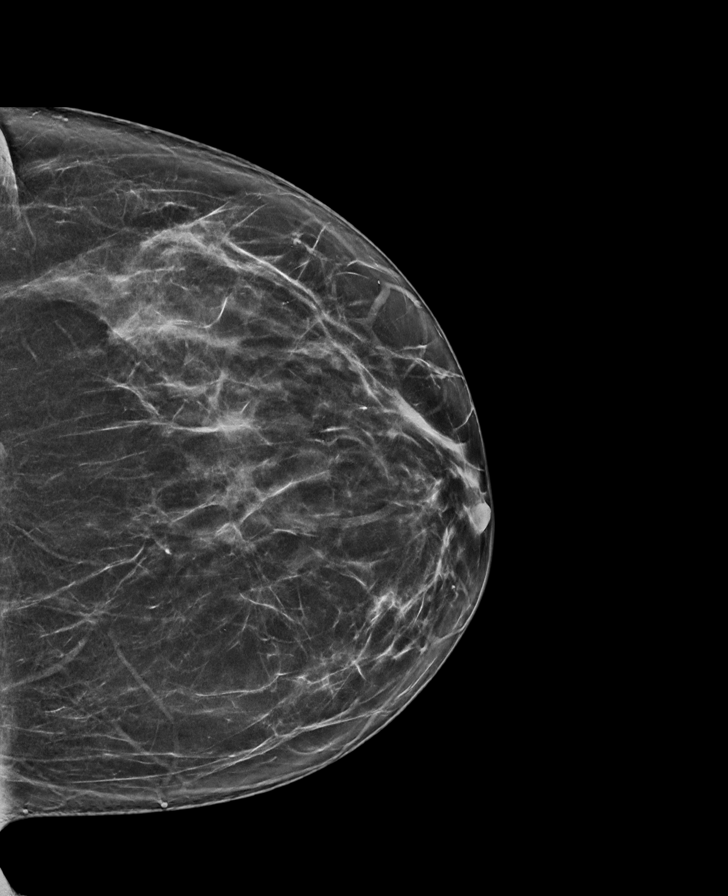

[R MLO synth-2D]
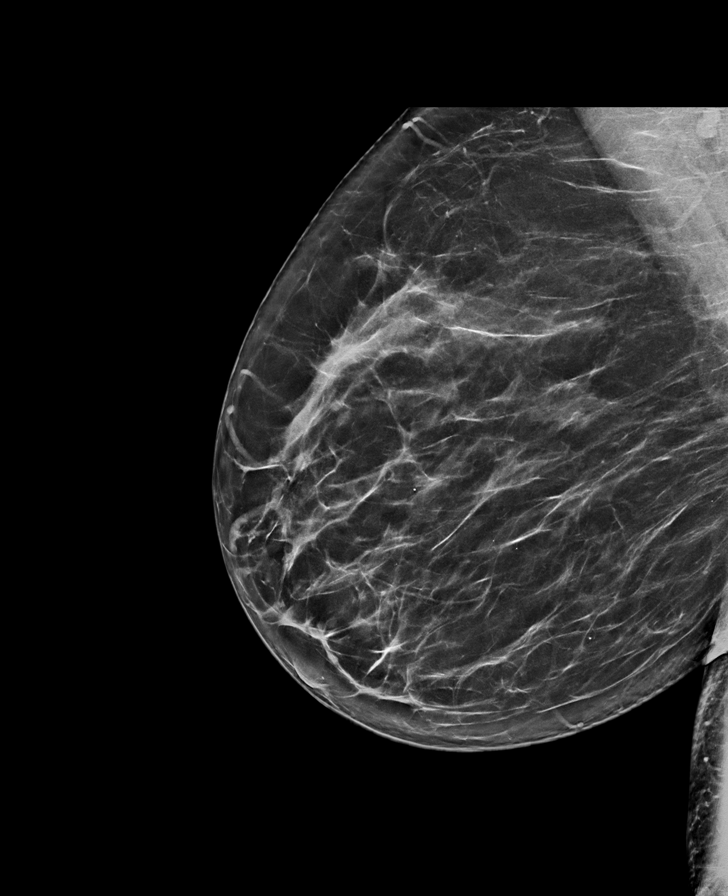

[L MLO synth-2D]
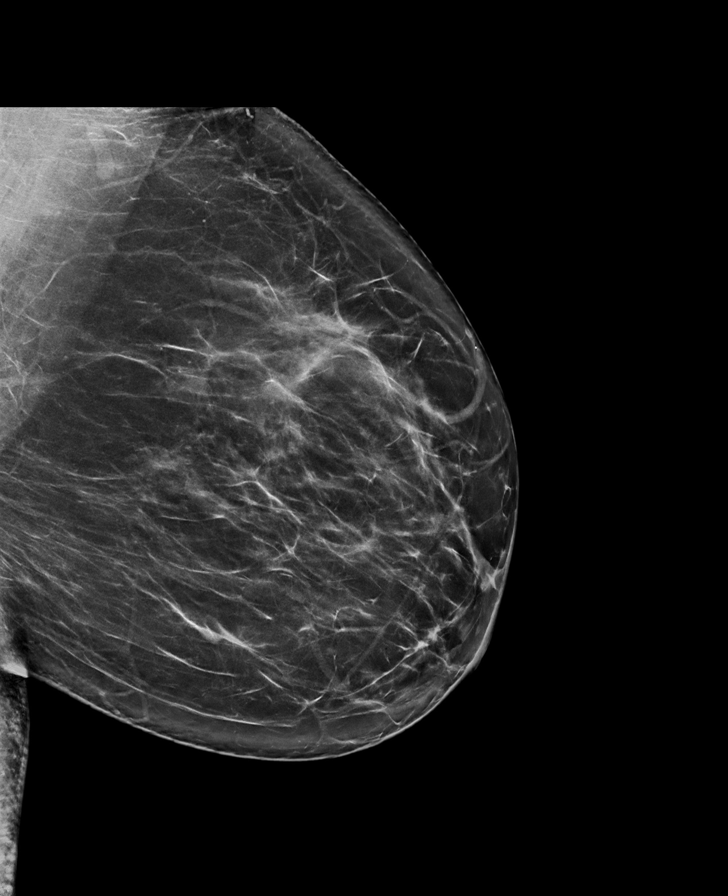

[L MLO tomo · tomo slice 45/88.0]
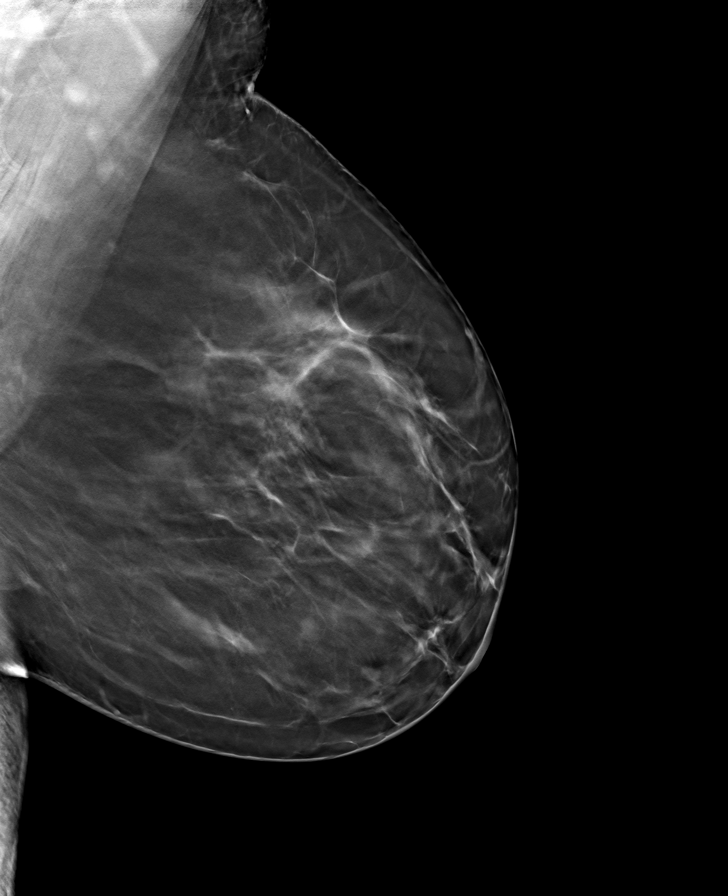

[L CC tomo · tomo slice 39/76.0]
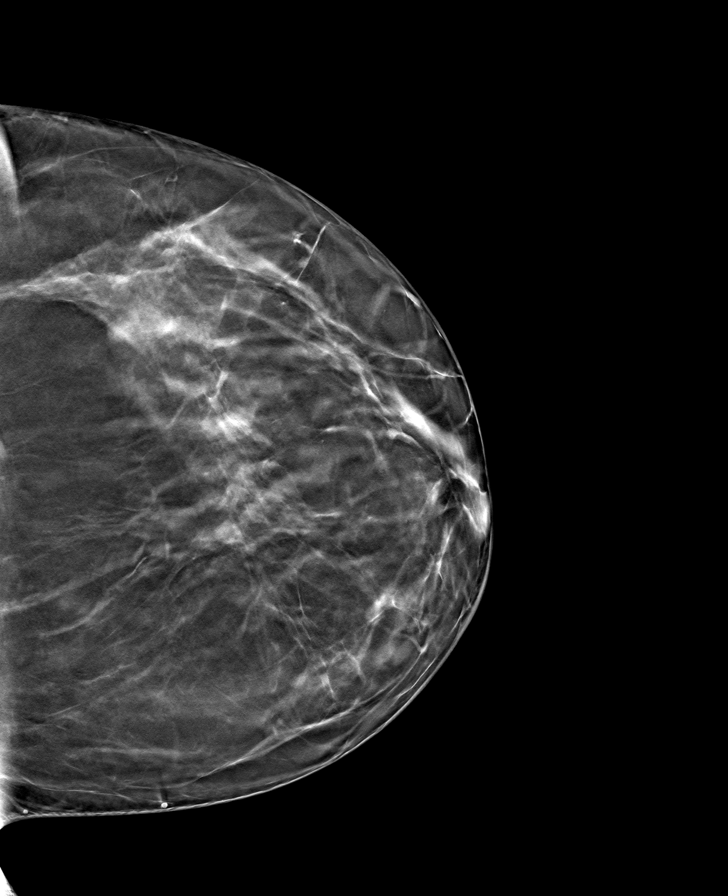

[R MLO tomo · tomo slice 43/84.0]
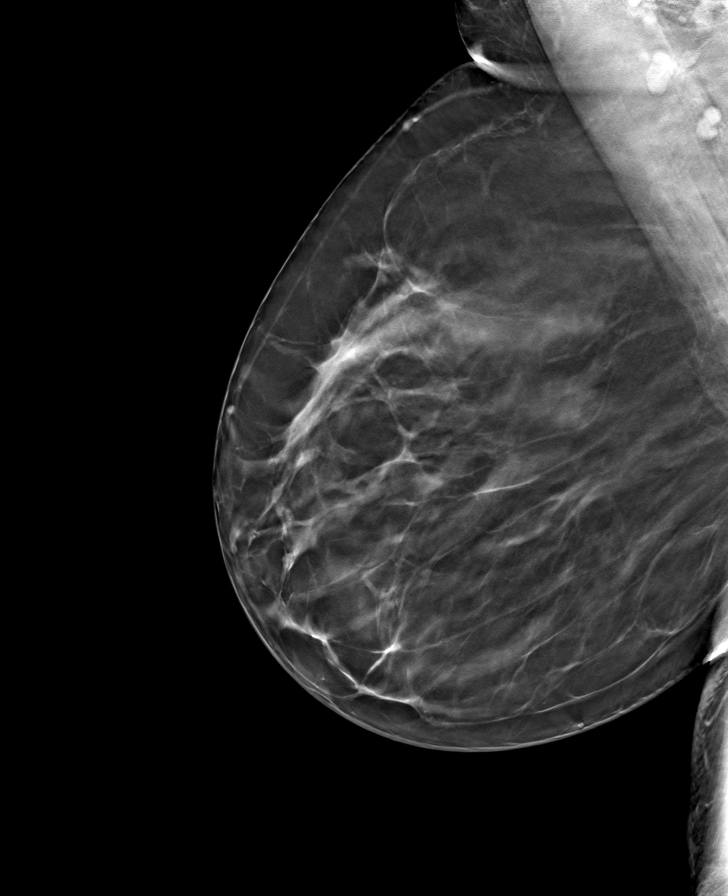

[R CC tomo · tomo slice 39/78.0]
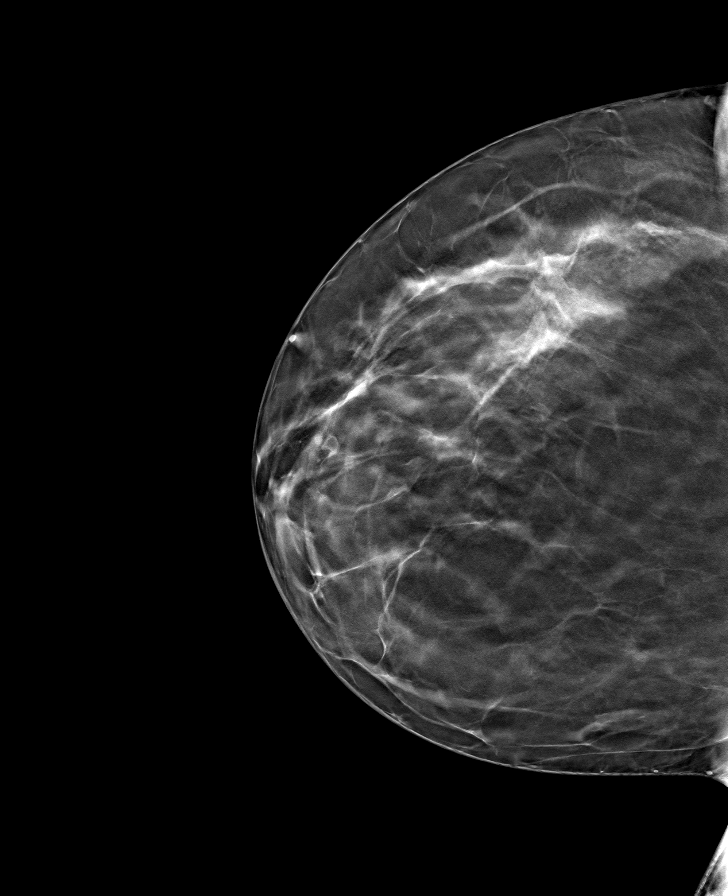

[8 of 24 positions shown; findings below may reference images not displayed]

ACR Breast Density Category c: The breast tissue is heterogeneously
dense, which may obscure small masses.
FINDINGS: There are no findings suspicious for malignancy. Images were
processed with CAD.
IMPRESSION: No mammographic evidence of malignancy. A result letter of this
screening mammogram will be mailed directly to the patient.

RECOMMENDATION:
Screening mammogram in one year. (Code:[id number]-[id number])

BI-RADS CATEGORY  1: Negative.

## 2019-08-25 ENCOUNTER — Ambulatory Visit: Payer: BLUE CROSS/BLUE SHIELD | Attending: Internal Medicine

## 2019-08-25 DIAGNOSIS — Z23 Encounter for immunization: Secondary | ICD-10-CM

## 2019-08-25 NOTE — Progress Notes (Signed)
   Covid-19 Vaccination Clinic  Name:  Tonya Pollard    MRN: [id number] DOB: 04-Apr-1976  08/25/2019  [name] was observed post Covid-19 immunization for 15 minutes without incident. She was provided with Vaccine Information Sheet and instruction to access the V-Safe system.   [name] was instructed to call 911 with any severe reactions post vaccine: Marland Kitchen Difficulty breathing  . Swelling of face and throat  . A fast heartbeat  . A bad rash all over body  . Dizziness and weakness   Immunizations Administered    Name Date Dose VIS Date Route   Pfizer COVID-19 Vaccine 08/25/2019 12:30 PM 0.3 mL 05/13/2019 Intramuscular   Manufacturer: [place]   Lot: [id number]   [place]: [id number]

## 2019-09-19 ENCOUNTER — Ambulatory Visit: Payer: BLUE CROSS/BLUE SHIELD | Attending: Internal Medicine

## 2019-09-19 DIAGNOSIS — Z23 Encounter for immunization: Secondary | ICD-10-CM

## 2019-09-19 NOTE — Progress Notes (Signed)
   Covid-19 Vaccination Clinic  Name:  Tonya Pollard    MRN: [id number] DOB: July 23, 1975  09/19/2019  Ms. [name] was observed post Covid-19 immunization for 15 minutes without incident. She was provided with Vaccine Information Sheet and instruction to access the V-Safe system.   Ms. [name] was instructed to call 911 with any severe reactions post vaccine: Marland Kitchen Difficulty breathing  . Swelling of face and throat  . A fast heartbeat  . A bad rash all over body  . Dizziness and weakness   Immunizations Administered    Name Date Dose VIS Date Route   Pfizer COVID-19 Vaccine 09/19/2019 11:14 AM 0.3 mL 07/27/2018 Intramuscular   Manufacturer: [place]   Lot: [id number]   [place]: [id number]

## 2021-09-30 ENCOUNTER — Ambulatory Visit: Payer: BLUE CROSS/BLUE SHIELD | Admitting: Obstetrics and Gynecology

## 2021-10-08 ENCOUNTER — Encounter: Payer: Self-pay | Admitting: Obstetrics and Gynecology

## 2021-10-08 ENCOUNTER — Ambulatory Visit: Payer: BC Managed Care – PPO | Admitting: Obstetrics and Gynecology

## 2021-10-08 ENCOUNTER — Other Ambulatory Visit: Payer: Self-pay | Admitting: Obstetrics and Gynecology

## 2021-10-08 VITALS — BP 130/83 | HR 89 | Ht 63.0 in | Wt 171.0 lb

## 2021-10-08 DIAGNOSIS — D259 Leiomyoma of uterus, unspecified: Secondary | ICD-10-CM | POA: Diagnosis not present

## 2021-10-08 NOTE — Progress Notes (Signed)
Pt states she has had pap and mammo with PCP-[facility]. ?In Care Everywhere. ? ?Pt has heavy cycles, was placed on BC to help control.  Pt states it has helped with cycle. Pt does take PO iron for anemia. ? ? ? ? ? ? ? ? ?

## 2021-10-08 NOTE — Progress Notes (Signed)
46 yo P1 with LMP 09/27/21 presenting for the evaluation of fibroid uterus. Patient reports a monthly period lasting 5 days with birth control pill. Previously her periods were 12- days long and associated with anemia. Patient has received iron infusion in the past and is currently taking iron supplement. Patient is interested in management of her fibroid uterus without hysterectomy ? ?Past Medical History:  ?Diagnosis Date  ? Iron deficiency anemia due to chronic blood loss 03/08/2014  ? Thrombocytosis ([place]) 03/08/2014  ? ?Past Surgical History:  ?Procedure Laterality Date  ? fibroids    ? ?Family History  ?Problem Relation Age of Onset  ? Cancer Maternal Aunt   ?     female cancer  ? Cancer Maternal Uncle   ?     throat ca  ? Cancer Paternal Aunt   ?     breast ca  ? Breast cancer Paternal Aunt   ?     in 84's  ? Cancer Paternal Aunt   ?     ovarian ca  ? Breast cancer Cousin   ? Breast cancer Paternal Aunt   ?     in 21's  ? Breast cancer Cousin   ? ?Social History  ? ?Tobacco Use  ? Smoking status: Never  ? Smokeless tobacco: Never  ?Substance Use Topics  ? Alcohol use: No  ? Drug use: No  ? ?ROS ?See pertinent in HPI. All other systems reviewed and non contributory ?Blood pressure 130/83, pulse 89, height '5\' 3"'$  (1.6 m), weight 171 lb (77.6 kg), last menstrual period 09/27/2021. ? ?GENERAL: Well-developed, well-nourished female in no acute distress.  ?HEENT: Normocephalic, atraumatic. Sclerae anicteric.  ?NECK: Supple. Normal thyroid.  ?LUNGS: Clear to auscultation bilaterally.  ?HEART: Regular rate and rhythm. ?BREASTS: Symmetric in size. No palpable masses or lymphadenopathy, skin changes, or nipple drainage. ?ABDOMEN: Soft, nontender, nondistended. Palpable fibroid uterus near umbilicus.  ?PELVIC: Not indicated ?EXTREMITIES: No cyanosis, clubbing, or edema, 2+ distal pulses. ? ?February 2023 ultrasound ?Procedure Note ? ?Maurice Small, MD - 07/12/2021  ?Formatting of this note might be different from the  original.  ?CLINICAL HISTORY: Personal history of other benign neoplasm  ? ?COMPARISON: None.  ? ?TECHNIQUE: Pelvic ultrasound: Transabdominal scanning followed by  transvaginal scanning of the pelvis was performed. 07/11/2021 2:00 PM  ? ?FINDINGS:  ? ?Uterus: Enlarged uterus measuring 16 x 9 x 9 cm. Lobulated contour is suggestive of fibroids. The myometrial echotexture is diffusely heterogeneous. Difficult to delineate this distinct masses.  ? ?Endometrial detail is limited by shadowing from fibroids. The visualized portion measures 0.8 cm.    ?Cervix: The cervix is normal in size and echotexture without mass.  ? ?The ovaries are not visualized. No free fluid.  ? ? ?IMPRESSION: Enlarged diffusely heterogeneous lobular uterus likely related to fibroids although discrete detail is obscured by heterogeneous echotexture.  ? ?Ovaries are not visualized.  ? ?Electronically Signed by: Maurice Small on 07/12/2021 9:26 AM ? ?A/P 46 yo with fibroid uterus ?- Discussed continue medical management with birth control pills ?- Patient interested in fibroid removal only. Discussed uterine artery embolization with IR. Patient is interested in that approach ?- Patient is current on pap smear and mammogram ?- Patient scheduled for colonoscopy this month ?

## 2022-12-10 ENCOUNTER — Other Ambulatory Visit: Payer: Self-pay | Admitting: Obstetrics and Gynecology

## 2022-12-10 ENCOUNTER — Ambulatory Visit
Admission: RE | Admit: 2022-12-10 | Discharge: 2022-12-10 | Disposition: A | Discharge status: Home / Self Care | Payer: BC Managed Care – PPO | Source: Ambulatory Visit | Attending: Obstetrics and Gynecology | Admitting: Obstetrics and Gynecology

## 2022-12-10 DIAGNOSIS — Z1231 Encounter for screening mammogram for malignant neoplasm of breast: Secondary | ICD-10-CM

## 2023-07-12 ENCOUNTER — Emergency Department (HOSPITAL_COMMUNITY)
Admission: EM | Admit: 2023-07-12 | Discharge: 2023-07-13 | Disposition: A | Discharge status: Home / Self Care | Payer: BC Managed Care – PPO | Attending: Emergency Medicine | Admitting: Emergency Medicine

## 2023-07-12 ENCOUNTER — Emergency Department (HOSPITAL_COMMUNITY): Payer: BC Managed Care – PPO

## 2023-07-12 ENCOUNTER — Encounter (HOSPITAL_COMMUNITY): Payer: Self-pay

## 2023-07-12 DIAGNOSIS — W260XXA Contact with knife, initial encounter: Secondary | ICD-10-CM | POA: Insufficient documentation

## 2023-07-12 DIAGNOSIS — Z23 Encounter for immunization: Secondary | ICD-10-CM | POA: Diagnosis not present

## 2023-07-12 DIAGNOSIS — Y93G1 Activity, food preparation and clean up: Secondary | ICD-10-CM | POA: Diagnosis not present

## 2023-07-12 DIAGNOSIS — S6992XA Unspecified injury of left wrist, hand and finger(s), initial encounter: Secondary | ICD-10-CM | POA: Diagnosis present

## 2023-07-12 DIAGNOSIS — S61211A Laceration without foreign body of left index finger without damage to nail, initial encounter: Secondary | ICD-10-CM | POA: Diagnosis not present

## 2023-07-12 NOTE — ED Triage Notes (Signed)
 Pt reports was cutting butter and cut left index finger. Bleeding controlled in triage. Unknown last tetanus.

## 2023-07-12 NOTE — ED Provider Triage Note (Signed)
 Emergency Medicine Provider Triage Evaluation Note  SPIRIT WERNLI , a 48 y.o. female  was evaluated in triage.  Pt complains of left index finger laceration.  Unknown last Tdap  Review of Systems  Positive: Was cutting asparagus and cut her left index finger, pain, oozing blood at triage Negative: Numbness, weakness, nailbed injury  Physical Exam  Ht 5' 3 (1.6 m)   Wt 80.7 kg   BMI 31.53 kg/m  Gen:   Awake, no distress   Resp:  Normal effort  MSK:   Moves extremities without difficulty  Other:  Was 2 radial pulses, full ROM  Medical Decision Making  Medically screening exam initiated at 7:34 PM.  Appropriate orders placed.  Sanders TAWNI MELKONIAN was informed that the remainder of the evaluation will be completed by another provider, this initial triage assessment does not replace that evaluation, and the importance of remaining in the ED until their evaluation is complete.  Imaging ordered   Francis Ileana LOISE DEVONNA 07/12/23 8062

## 2023-07-13 MED ORDER — LIDOCAINE HCL (PF) 1 % IJ SOLN
30.0000 mL | Freq: Once | INTRAMUSCULAR | Status: AC
Start: 2023-07-13 — End: 2023-07-13
  Administered 2023-07-13: 30 mL
  Filled 2023-07-13: qty 30

## 2023-07-13 MED ORDER — TETANUS-DIPHTH-ACELL PERTUSSIS 5-2.5-18.5 LF-MCG/0.5 IM SUSY
0.5000 mL | PREFILLED_SYRINGE | Freq: Once | INTRAMUSCULAR | Status: AC
  Administered 2023-07-13: 0.5 mL via INTRAMUSCULAR
  Filled 2023-07-13: qty 0.5

## 2023-07-13 NOTE — ED Provider Notes (Addendum)
 [place] [facility] Provider Note   CSN: [id number] Arrival date & time: 07/12/23  8657     History  Chief Complaint  Patient presents with   Finger Injury    LEFT INDEX    Tonya Pollard is a 48 y.o. female who presents with laceration to the left index finger after cutting it with clean paring knife while preparing dinner around 6 pm, bleeding heavily, controlled by time of arrival in triage. Tetanus is not UTD.Some tingling in the finger, throbbing. No pain medis prior to arrival,. No anticoagulation.   HPI     Home Medications Prior to Admission medications   Medication Sig Start Date End Date Taking? Authorizing Provider  Iron TABS Take by mouth 2 (two) times daily.    [provider]  norgestimate-ethinyl estradiol (ORTHO-CYCLEN) 0.25-35 MG-MCG tablet Take 1 tablet by mouth daily.    [provider]      Allergies    Penicillins    Review of Systems   Review of Systems  Skin:  Positive for wound.    Physical Exam Updated Vital Signs BP 131/74   Pulse 68   Temp 98.2 F (36.8 C) (Oral)   Resp 18   Ht 5\' 3"  (1.6 m)   Wt 80.7 kg   SpO2 100%   BMI 31.53 kg/m  Physical Exam Vitals and nursing note reviewed.  HENT:     Head: Normocephalic and atraumatic.  Eyes:     General: No scleral icterus.       Right eye: No discharge.        Left eye: No discharge.     Conjunctiva/sclera: Conjunctivae normal.  Pulmonary:     Effort: Pulmonary effort is normal.  Musculoskeletal:       Hands:  Skin:    General: Skin is warm and dry.  Neurological:     General: No focal deficit present.     Mental Status: She is alert.  Psychiatric:        Mood and Affect: Mood normal.     ED Results / Procedures / Treatments   Labs (all labs ordered are listed, but only abnormal results are displayed) Labs Reviewed - No data to display  EKG None  Radiology DG Finger Index Left Result Date: 07/12/2023 CLINICAL  DATA:  Laceration of the left index finger. EXAM: LEFT INDEX FINGER 2+V COMPARISON:  None Available. FINDINGS: There is no acute fracture or dislocation. The bones are well mineralized. No arthritic changes. Laceration of the soft tissues of the tip of the index finger. Overlying dressing noted. No radiopaque foreign object. IMPRESSION: 1. No acute fracture or dislocation. 2. Laceration of the soft tissues of the tip of the index finger. Electronically Signed   By: Angus Bark M.D.   On: 07/12/2023 20:07    Procedures .Laceration Repair  Date/Time: 07/13/2023 5:05 AM  Performed by: Kae Oram, PA-C Authorized by: Kae Oram, PA-C   Consent:    Consent obtained:  Verbal   Consent given by:  Patient   Risks discussed:  Infection, need for additional repair, nerve damage, poor wound healing, poor cosmetic result and pain   Alternatives discussed:  No treatment Universal protocol:    Patient identity confirmed:  Verbally with patient Anesthesia:    Anesthesia method:  Nerve block   Block location:  Digital block   Block needle gauge:  25 G   Block anesthetic:  Lidocaine  1% w/o epi  Block technique:  Digital block   Block injection procedure:  Introduced needle, anatomic landmarks identified, anatomic landmarks palpated and negative aspiration for blood   Block outcome:  Anesthesia achieved Laceration details:    Location:  Finger   Finger location:  L index finger   Length (cm):  3 Pre-procedure details:    Preparation:  Imaging obtained to evaluate for foreign bodies Exploration:    Limited defect created (wound extended): no     Imaging obtained: x-ray     Imaging outcome: foreign body not noted     Wound exploration: wound explored through full range of motion     Wound extent: no underlying fracture and no vascular damage     Contaminated: no   Treatment:    Area cleansed with:  Saline   Amount of cleaning:  Standard Skin repair:    Repair method:   Sutures   Suture size:  4-0   Suture material:  Prolene   Suture technique:  Simple interrupted   Number of sutures:  6 Approximation:    Approximation:  Close Repair type:    Repair type:  Simple Post-procedure details:    Dressing:  Antibiotic ointment   Procedure completion:  Tolerated well, no immediate complications     Medications Ordered in ED Medications  lidocaine  (PF) (XYLOCAINE ) 1 % injection 30 mL (30 mLs Other Given by Other 07/13/23 0505)  Tdap (BOOSTRIX ) injection 0.5 mL (0.5 mLs Intramuscular Given 07/13/23 0420)    ED Course/ Medical Decision Making/ A&P                                 Medical Decision Making 48 y/o female with left index finger laceration. See photo.   Normal neurovascular status in the finger. See photo above of laceration.   Amount and/or Complexity of Data Reviewed Radiology: independent interpretation performed.    Details: No FB or ossesous abnormality on xray. Visualized by this provider.   Risk Prescription drug management.   Boostrix  administered, wound repaired as above. Wound care discussed. Patient tolerated the procedure well.  Neurovascularly intact following repair. Lyrics voiced understanding of her medical evaluation and treatment plan. Each of their questions answered to their expressed satisfaction.  Return precautions were given.  Patient is well-appearing, stable, and was discharged in good condition.  This chart was dictated using voice recognition software, Dragon. Despite the best efforts of this provider to proofread and correct errors, errors may still occur which can change documentation meaning.  Final Clinical Impression(s) / ED Diagnoses Final diagnoses:  Laceration of left index finger without foreign body without damage to nail, initial encounter    Rx / DC Orders ED Discharge Orders     None         Kae Oram, PA-C 07/13/23 0511    Rosealee Concha, MD 07/13/23 [phone]

## 2023-07-13 NOTE — ED Notes (Signed)
 Patient hand placed in basin with NS at this time.

## 2023-07-13 NOTE — Discharge Instructions (Signed)
 Your cut was repaired with sutures which will need to be removed in 10-14 days by your PCP/urgent care/ER. Please do not submerge your hand in water until the sutures have been removed.   Please keep the wound clean and dry, change the dressing daily. Return with any redness, pus-like drainage, or significantly worsening pain.

## 2023-12-11 ENCOUNTER — Other Ambulatory Visit: Payer: Self-pay | Admitting: Physician Assistant

## 2023-12-11 DIAGNOSIS — Z1231 Encounter for screening mammogram for malignant neoplasm of breast: Secondary | ICD-10-CM

## 2023-12-25 ENCOUNTER — Ambulatory Visit
Admission: RE | Admit: 2023-12-25 | Discharge: 2023-12-25 | Disposition: A | Discharge status: Home / Self Care | Source: Ambulatory Visit

## 2023-12-25 DIAGNOSIS — Z1231 Encounter for screening mammogram for malignant neoplasm of breast: Secondary | ICD-10-CM
